# Patient Record
Sex: Male | Born: 1985
Health system: Southern US, Community
[De-identification: ages and names within clinical notes are randomized; demographics above are authoritative.]

---

## 2009-12-02 ENCOUNTER — Emergency Department (HOSPITAL_BASED_OUTPATIENT_CLINIC_OR_DEPARTMENT_OTHER): Admission: EM | Admit: 2009-12-02 | Discharge: 2009-12-02 | Payer: Self-pay | Admitting: Emergency Medicine

## 2010-12-20 LAB — RAPID STREP SCREEN (MED CTR MEBANE ONLY): Streptococcus, Group A Screen (Direct): NEGATIVE

## 2016-01-10 ENCOUNTER — Encounter (HOSPITAL_BASED_OUTPATIENT_CLINIC_OR_DEPARTMENT_OTHER): Payer: Self-pay | Admitting: Emergency Medicine

## 2016-01-10 ENCOUNTER — Emergency Department (HOSPITAL_BASED_OUTPATIENT_CLINIC_OR_DEPARTMENT_OTHER): Payer: Self-pay

## 2016-01-10 ENCOUNTER — Emergency Department (HOSPITAL_BASED_OUTPATIENT_CLINIC_OR_DEPARTMENT_OTHER)
Admission: EM | Admit: 2016-01-10 | Discharge: 2016-01-10 | Disposition: A | Discharge status: Home / Self Care | Payer: Self-pay | Attending: Emergency Medicine | Admitting: Emergency Medicine

## 2016-01-10 DIAGNOSIS — M79671 Pain in right foot: Secondary | ICD-10-CM | POA: Insufficient documentation

## 2016-01-10 DIAGNOSIS — F17200 Nicotine dependence, unspecified, uncomplicated: Secondary | ICD-10-CM | POA: Insufficient documentation

## 2016-01-10 DIAGNOSIS — Y929 Unspecified place or not applicable: Secondary | ICD-10-CM | POA: Insufficient documentation

## 2016-01-10 DIAGNOSIS — Y999 Unspecified external cause status: Secondary | ICD-10-CM | POA: Insufficient documentation

## 2016-01-10 DIAGNOSIS — Y9367 Activity, basketball: Secondary | ICD-10-CM | POA: Insufficient documentation

## 2016-01-10 DIAGNOSIS — X58XXXA Exposure to other specified factors, initial encounter: Secondary | ICD-10-CM | POA: Insufficient documentation

## 2016-01-10 MED ORDER — IBUPROFEN 800 MG PO TABS: 800.0000 mg | ORAL_TABLET | Freq: Once | ORAL | Status: DC

## 2016-01-10 MED ORDER — IBUPROFEN 800 MG PO TABS: 800.0000 mg | ORAL_TABLET | Freq: Three times a day (TID) | ORAL | Status: DC

## 2016-01-10 NOTE — ED Notes (Signed)
Pt states he injured R ankle playing basketball yesterday, woke today and ankle was swollen. Ambulatory with steady gait.

## 2016-01-10 NOTE — Discharge Instructions (Signed)
1. Medications: motrin, usual home medications 2. Treatment: rest, drink plenty of fluids, ice, elevate 3. Follow Up: please followup with your primary doctor and with ortho for discussion of your diagnoses and further evaluation after today's visit; if you do not have a primary care doctor use the phone number listed in your discharge paperwork to find one; please return to the ER for increased pain, numbness, new or worsening symptoms

## 2016-01-10 NOTE — ED Provider Notes (Signed)
CSN: [id number]     Arrival date & time 01/10/16  2125 History   First MD Initiated Contact with Patient 01/10/16 2301     Chief Complaint  Patient presents with  . Ankle Injury    HPI   Ruben Carter is a 30 y.o. male with no pertinent PMH who presents to the ED with right ankle pain. He states he was playing basketball yesterday, and reports his symptoms started after that time. He reports movement and weight bearing exacerbate his pain. He has not tried anything for symptom relief. He denies numbness, weakness, paresthesia, additional injury.   History reviewed. No pertinent past medical history. History reviewed. No pertinent past surgical history. History reviewed. No pertinent family history. Social History  Substance Use Topics  . Smoking status: Current Every Day Smoker  . Smokeless tobacco: None  . Alcohol Use: Yes     Review of Systems  Musculoskeletal: Positive for arthralgias.  Neurological: Negative for weakness and numbness.      Allergies  Penicillins  Home Medications   Prior to Admission medications   Medication Sig Start Date End Date Taking? Authorizing Provider  ibuprofen (ADVIL,MOTRIN) 800 MG tablet Take 1 tablet (800 mg total) by mouth 3 (three) times daily. 01/10/16   Dorise Hiss Westfall, PA-C    BP 145/91 mmHg  Pulse 89  Temp(Src) 99.4 F (37.4 C) (Oral)  Resp 18  Ht  (1.854 m)  Wt 127.007 kg  BMI 36.95 kg/m2  SpO2 99% Physical Exam  Constitutional: He is oriented to person, place, and time. He appears well-developed and well-nourished. No distress.  HENT:  Head: Normocephalic and atraumatic.  Right Ear: External ear normal.  Left Ear: External ear normal.  Nose: Nose normal.  Eyes: Conjunctivae and EOM are normal. Right eye exhibits no discharge. Left eye exhibits no discharge. No scleral icterus.  Neck: Normal range of motion. Neck supple.  Cardiovascular: Normal rate, regular rhythm and intact distal pulses.    Pulmonary/Chest: Effort normal and breath sounds normal. No respiratory distress.  Musculoskeletal: Normal range of motion. He exhibits tenderness. He exhibits no edema.  Mild tenderness to palpation to posterior aspect of heel. No tenderness to palpation to achilles. Full range of motion. Strength and sensation intact. Distal pulses intact. No significant edema, erythema, or heat.  Neurological: He is alert and oriented to person, place, and time. He has normal strength. No sensory deficit.  Skin: Skin is warm and dry. He is not diaphoretic.  Psychiatric: He has a normal mood and affect. His behavior is normal.  Nursing note and vitals reviewed.   ED Course  Procedures (including critical care time)  Labs Review Labs Reviewed - No data to display  Imaging Review Dg Ankle Complete Right  01/10/2016  CLINICAL DATA:  Right ankle injury while playing basketball yesterday. Continued swelling. EXAM: RIGHT ANKLE - COMPLETE 3+ VIEW COMPARISON:  None. FINDINGS: There is no evidence of fracture, dislocation, or joint effusion. There is no evidence of arthropathy or other focal bone abnormality. Soft tissues are unremarkable. IMPRESSION: Negative. Electronically Signed   By: Burman Nieves M.D.   On: 01/10/2016 22:22   I have personally reviewed and evaluated these images as part of my medical decision-making.   EKG Interpretation None      MDM   Final diagnoses:  Right foot pain    29 year old male presents with right ankle pain after playing basketball yesterday. Denies numbness, weakness, paresthesia. Patient is afebrile. Vital signs stable. On exam,  he has mild tenderness to palpation to the posterior aspect of his right heel. No tenderness to palpation to achilles. Full range of motion. Strength and sensation intact. Distal pulses intact. No significant edema, erythema, or heat. Imaging includes area where patient expresses pain on exam and is negative for fracture, dislocation, or  joint effusion. Discussed findings with patient. He is nontoxic and well-appearing, feel he is stable for discharge at this time. Will give ankle brace for comfort. Advised to rest, ice, elevate, and take ibuprofen for pain. Patient to follow-up with PCP and with ortho for persistent symptoms. Return precautions discussed. Patient verbalizes his understanding and is in agreement with plan.  BP 145/91 mmHg  Pulse 89  Temp(Src) 99.4 F (37.4 C) (Oral)  Resp 18  Ht 6\' 1"  (1.854 m)  Wt 127.007 kg  BMI 36.95 kg/m2  SpO2 99%    Ruben Gemmalizabeth C Westfall, PA-C 01/11/16 0020  Ruben LibraJohn Molpus, MD 01/11/16 [phone]

## 2016-01-10 NOTE — ED Notes (Signed)
Went to go apply splint and ice to pt and pt was not in room. Secretary stated that he just walked out, nurse said he was not discharged yet.

## 2016-01-11 NOTE — ED Notes (Signed)
Went to d/c pt and they were not in room.

## 2016-06-20 ENCOUNTER — Encounter (HOSPITAL_BASED_OUTPATIENT_CLINIC_OR_DEPARTMENT_OTHER): Payer: Self-pay | Admitting: *Deleted

## 2016-06-20 ENCOUNTER — Emergency Department (HOSPITAL_BASED_OUTPATIENT_CLINIC_OR_DEPARTMENT_OTHER)
Admission: EM | Admit: 2016-06-20 | Discharge: 2016-06-20 | Disposition: A | Discharge status: Home / Self Care | Payer: BLUE CROSS/BLUE SHIELD | Attending: Emergency Medicine | Admitting: Emergency Medicine

## 2016-06-20 DIAGNOSIS — L02411 Cutaneous abscess of right axilla: Secondary | ICD-10-CM | POA: Insufficient documentation

## 2016-06-20 DIAGNOSIS — F17210 Nicotine dependence, cigarettes, uncomplicated: Secondary | ICD-10-CM | POA: Insufficient documentation

## 2016-06-20 MED ORDER — LIDOCAINE HCL (PF) 1 % IJ SOLN
10.0000 mL | Freq: Once | INTRAMUSCULAR | Status: AC
  Administered 2016-06-20: 10 mL via INTRADERMAL
  Filled 2016-06-20: qty 10

## 2016-06-20 MED ORDER — SULFAMETHOXAZOLE-TRIMETHOPRIM 800-160 MG PO TABS: 1.0000 | ORAL_TABLET | Freq: Two times a day (BID) | ORAL | 0 refills | Status: AC

## 2016-06-20 MED FILL — SULFAMETHOXAZOLE-TMP DS TAB: 800-160 | 7 days supply | Qty: 14 | Fill #0

## 2016-06-20 NOTE — Discharge Instructions (Signed)
Take the antibiotic if. Read the attachments. You have increased pain, swelling, redness, fluid drainage, or bleeding. You have muscle aches, chills, or a general ill feeling. You have a fever.

## 2016-06-20 NOTE — ED Triage Notes (Signed)
Abscess under right arm x 2 days.  Denies fever

## 2016-06-22 NOTE — ED Provider Notes (Signed)
[facility]-EMERGENCY DEPT Provider Note   CSN: [id number] Arrival date & time: 06/20/16  1100     History   Chief Complaint Chief Complaint  Patient presents with  . Abscess    HPI Ruben Carter is a 30 y.o. male.   Patient presents for evaluation of a cutaneous abscess. Lesion is located in the right axilla. Onset was 2 days ago. Symptoms have gradually worsened. Abscess has associated symptoms of pain. Patient does not have previous history of cutaneous abscesses. Patient does not have diabetes. Denies: fever, chills, myalgias, malaise.             History reviewed. No pertinent past medical history.  There are no active problems to display for this patient.   History reviewed. No pertinent surgical history.     Home Medications    Prior to Admission medications   Medication Sig Start Date End Date Taking? Authorizing Provider  ibuprofen (ADVIL,MOTRIN) 800 MG tablet Take 1 tablet (800 mg total) by mouth 3 (three) times daily. 01/10/16   Mady Gemma, PA-C  sulfamethoxazole-trimethoprim (BACTRIM DS,SEPTRA DS) 800-160 MG tablet Take 1 tablet by mouth 2 (two) times daily. 06/20/16 06/27/16  Arthor Captain, PA-C   Family History History reviewed. No pertinent family history.  Social History Social History  Substance Use Topics  . Smoking status: Current Every Day Smoker    Packs/day: 0.50    Types: Cigarettes  . Smokeless tobacco: Never Used  . Alcohol use Yes     Allergies   Penicillins   Review of Systems Review of Systems Ten systems reviewed and are negative for acute change, except as noted in the HPI.    Physical Exam Updated Vital Signs BP 124/75 (BP Location: Left Arm)   Pulse 72   Temp 98.1 F (36.7 C) (Oral)   Resp 18   Ht 6\' 2"  (1.88 m)   Wt (!) 153.3 kg   SpO2 99%   BMI 43.40 kg/m   Physical Exam  Constitutional: He appears well-developed and well-nourished. No distress.  HENT:  Head: Normocephalic and atraumatic.    Eyes: Conjunctivae are normal. No scleral icterus.  Neck: Normal range of motion. Neck supple.  Cardiovascular: Normal rate, regular rhythm and normal heart sounds.   Pulmonary/Chest: Effort normal and breath sounds normal. No respiratory distress.  Abdominal: Soft. There is no tenderness.  Musculoskeletal: He exhibits no edema.  Neurological: He is alert.  Skin: Skin is warm and dry. He is not diaphoretic.  R AXILLA WITH FLUCTUANT MASS, MILD SURROUNDING INDURATION WITHOUT STREAKING.   Psychiatric: His behavior is normal.  Nursing note and vitals reviewed.    ED Treatments / Results  Labs (all labs ordered are listed, but only abnormal results are displayed) Labs Reviewed - No data to display  EKG  EKG Interpretation None       Radiology No results found.  Procedures .Marland KitchenIncision and Drainage Date/Time: 06/22/2016 3:48 PM Performed by: Arthor Captain Authorized by: Arthor Captain   Consent:    Consent obtained:  Verbal   Risks discussed:  Incomplete drainage, bleeding, pain, infection and damage to other organs   Alternatives discussed:  No treatment, delayed treatment, alternative treatment and observation Location:    Type:  Abscess   Size:  5   Location: R AXILLA. Pre-procedure details:    Skin preparation:  Betadine Anesthesia (see MAR for exact dosages):    Anesthesia method:  Local infiltration   Local anesthetic:  Lidocaine 1% w/o epi Procedure type:  Complexity:  Simple Procedure details:    Needle aspiration: no     Incision types:  Single straight   Incision depth:  Dermal   Scalpel blade:  11   Wound management:  Probed and deloculated, irrigated with saline and extensive cleaning   Drainage:  Purulent and bloody   Drainage amount:  Moderate   Wound treatment:  Wound left open   Packing materials:  None Post-procedure details:    Patient tolerance of procedure:  Tolerated well, no immediate complications   (including critical care  time)  Medications Ordered in ED Medications  lidocaine (PF) (XYLOCAINE) 1 % injection 10 mL (10 mLs Intradermal Given 06/20/16 1219)     Initial Impression / Assessment and Plan / ED Course  I have reviewed the triage vital signs and the nursing notes.  Pertinent labs & imaging results that were available during my care of the patient were reviewed by me and considered in my medical decision making (see chart for details).  Clinical Course   Patient with skin abscess amenable to incision and drainage.  Abscess was not large enough to warrant packing or drain,  wound recheck in 2 days. Encouraged home warm soaks and flushing.  Mild signs of cellulitis is surrounding skin.  Will d/c to home.  No antibiotic therapy is indicated.   Final Clinical Impressions(s) / ED Diagnoses   Final diagnoses:  Abscess of axilla, right    New Prescriptions Discharge Medication List as of 06/20/2016 12:57 PM    START taking these medications   Details  sulfamethoxazole-trimethoprim (BACTRIM DS,SEPTRA DS) 800-160 MG tablet Take 1 tablet by mouth 2 (two) times daily., Starting Mon 06/20/2016, Until Mon 06/27/2016, Print         MorrowAbigail Carloyn Lahue, PA-C 06/22/16 1550    Nelva Nayobert Beaton, MD 06/24/16 [phone]

## 2017-03-29 IMAGING — CR DG ANKLE COMPLETE 3+V*R*
3 series · 3 of 3 positions shown · non-contrast
Comparison: None.

CLINICAL DATA: Right ankle injury while playing basketball
yesterday. Continued swelling.

EXAM:
RIGHT ANKLE - COMPLETE 3+ VIEW

[t ankle joint ap right]
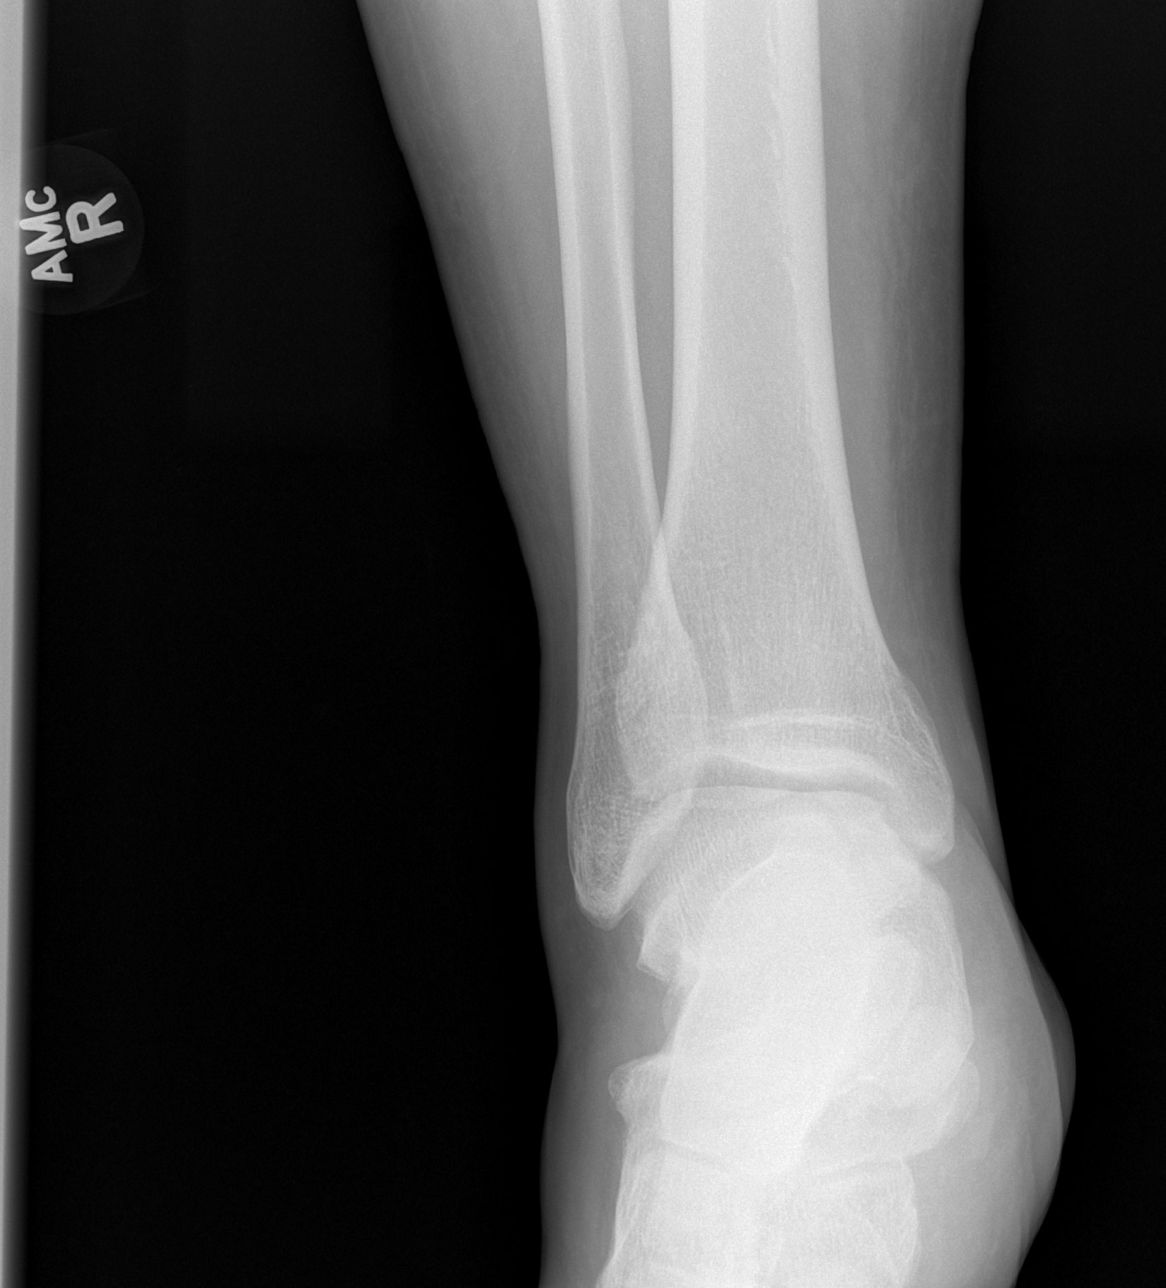

[t ankle joint oblique right]
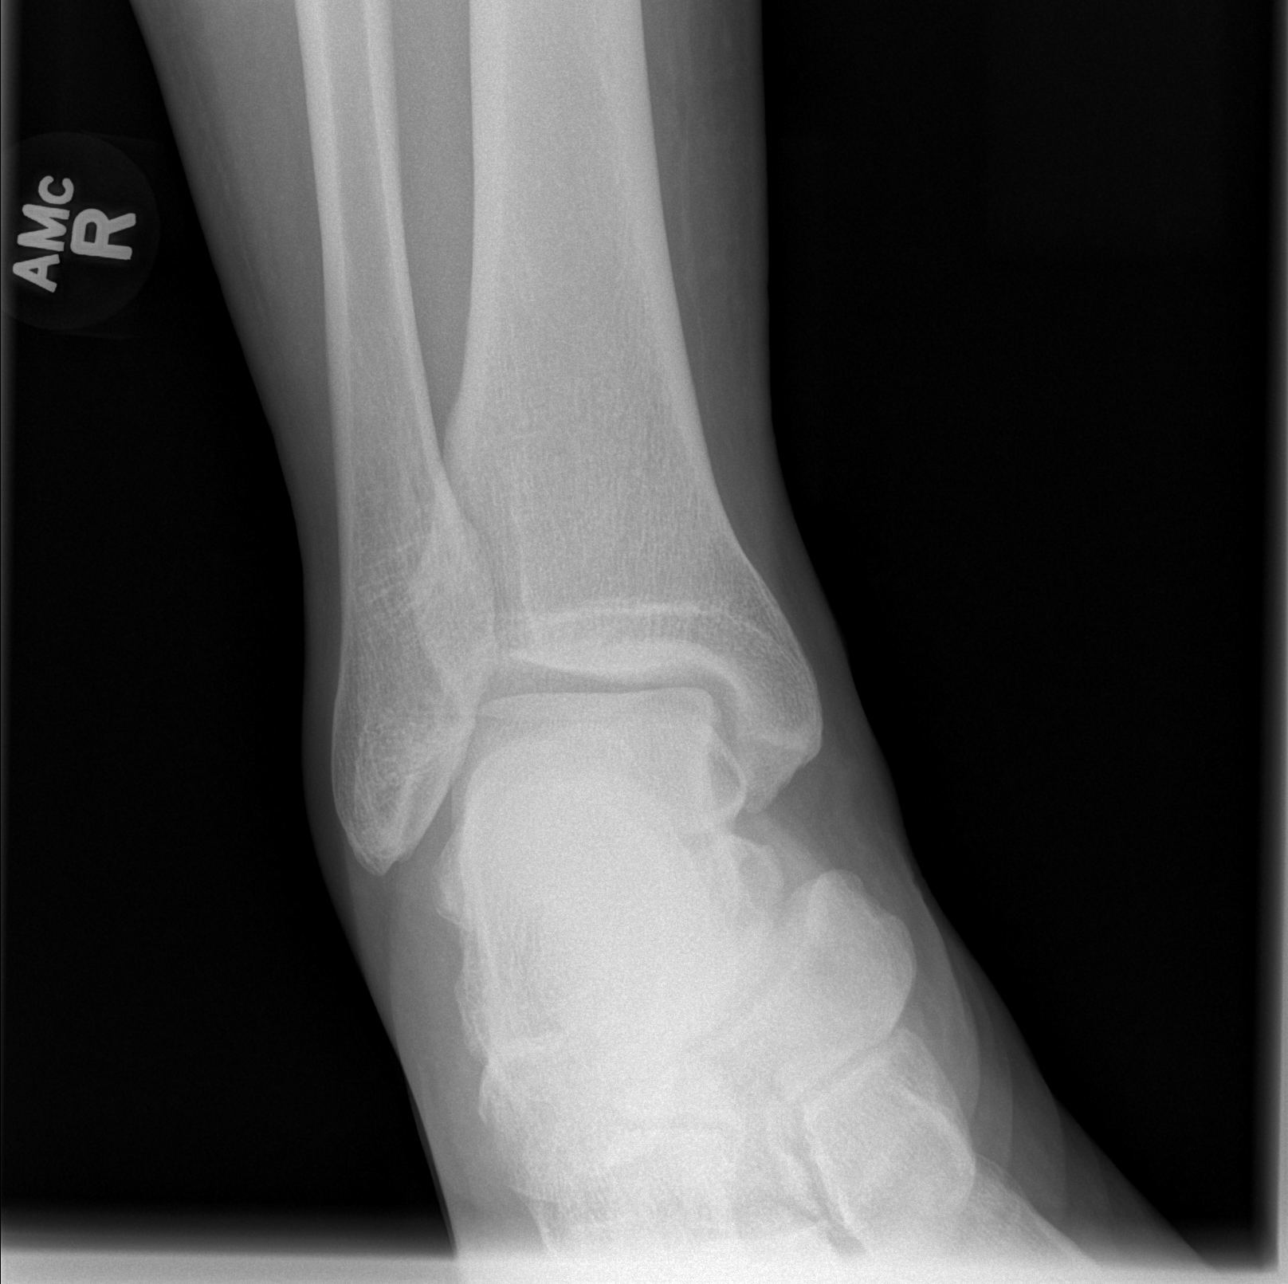

[t ankle joint lat right]
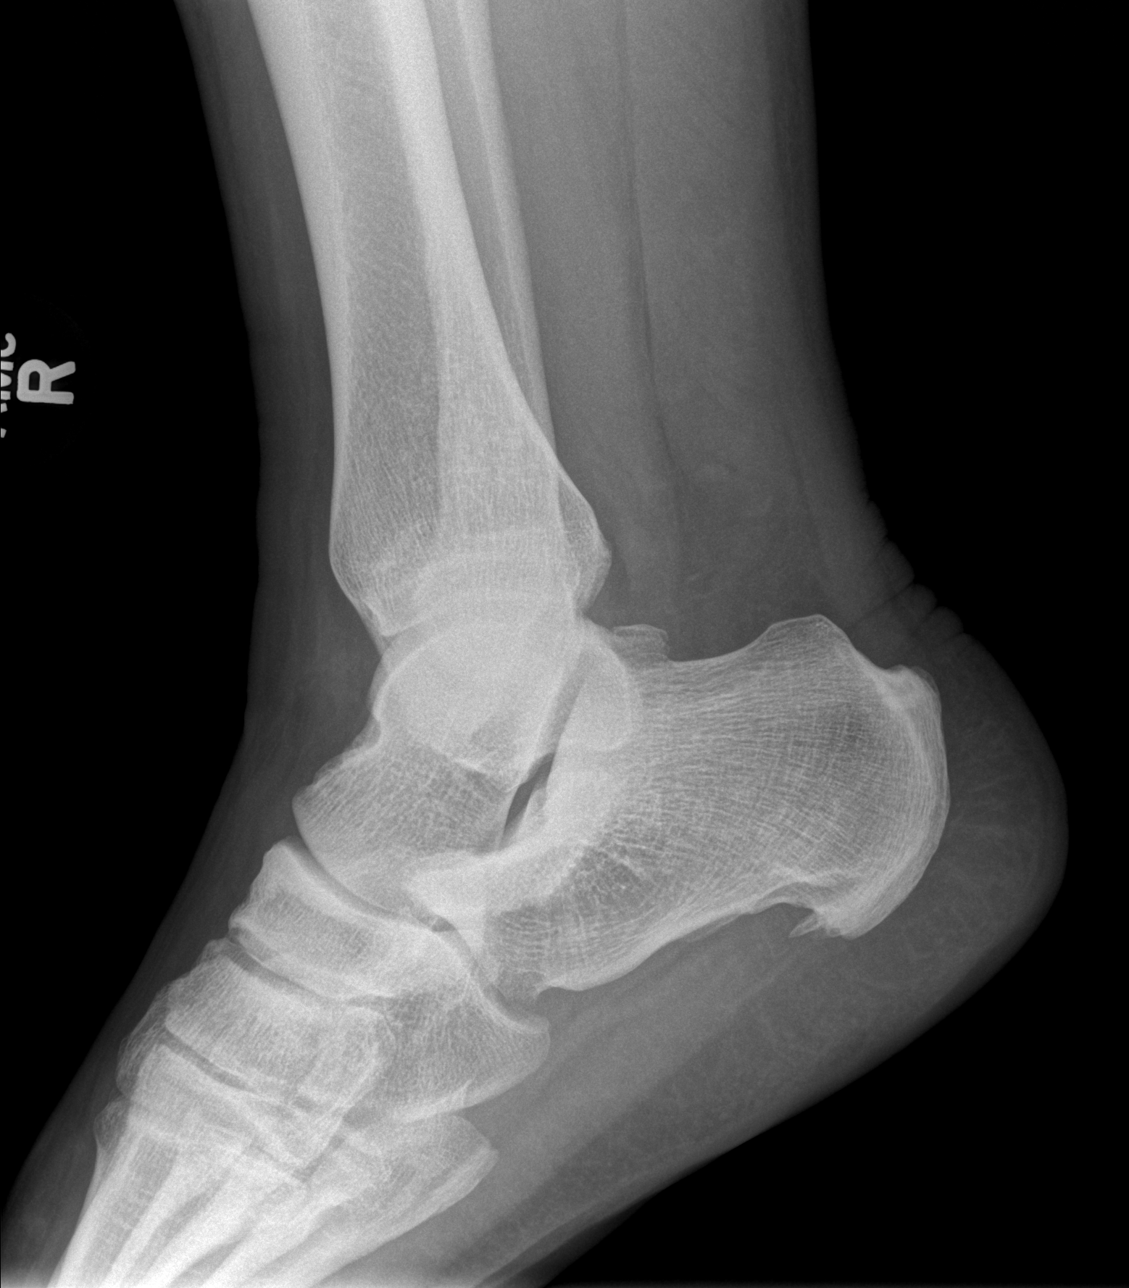

[3 of 3 positions shown; findings below may reference images not displayed]

FINDINGS: There is no evidence of fracture, dislocation, or joint effusion.
There is no evidence of arthropathy or other focal bone abnormality.
Soft tissues are unremarkable.
IMPRESSION: Negative.

## 2020-03-02 ENCOUNTER — Emergency Department (HOSPITAL_BASED_OUTPATIENT_CLINIC_OR_DEPARTMENT_OTHER)
Admission: EM | Admit: 2020-03-02 | Discharge: 2020-03-03 | Disposition: A | Discharge status: Home / Self Care | Payer: 59 | Attending: Emergency Medicine | Admitting: Emergency Medicine

## 2020-03-02 ENCOUNTER — Other Ambulatory Visit: Payer: Self-pay

## 2020-03-02 ENCOUNTER — Encounter (HOSPITAL_BASED_OUTPATIENT_CLINIC_OR_DEPARTMENT_OTHER): Payer: Self-pay | Admitting: Emergency Medicine

## 2020-03-02 DIAGNOSIS — R059 Cough, unspecified: Secondary | ICD-10-CM

## 2020-03-02 DIAGNOSIS — R05 Cough: Secondary | ICD-10-CM | POA: Diagnosis present

## 2020-03-02 DIAGNOSIS — F17210 Nicotine dependence, cigarettes, uncomplicated: Secondary | ICD-10-CM | POA: Diagnosis not present

## 2020-03-02 DIAGNOSIS — J3489 Other specified disorders of nose and nasal sinuses: Secondary | ICD-10-CM | POA: Insufficient documentation

## 2020-03-02 DIAGNOSIS — R0981 Nasal congestion: Secondary | ICD-10-CM | POA: Insufficient documentation

## 2020-03-02 DIAGNOSIS — Z20822 Contact with and (suspected) exposure to covid-19: Secondary | ICD-10-CM | POA: Insufficient documentation

## 2020-03-02 DIAGNOSIS — R439 Unspecified disturbances of smell and taste: Secondary | ICD-10-CM | POA: Diagnosis not present

## 2020-03-03 LAB — SARS CORONAVIRUS 2 (TAT 6-24 HRS): SARS Coronavirus 2: NEGATIVE

## 2020-03-03 NOTE — ED Triage Notes (Signed)
Cough, runny nose, congestion, loss of taste and smell since Sat

## 2020-03-03 NOTE — ED Provider Notes (Signed)
[facility] EMERGENCY DEPARTMENT Provider Note  CSN: [id number] Arrival date & time: 03/02/20 2323  Chief Complaint(s) Cough  HPI Ruben Carter is a 34 y.o. male   The history is provided by the patient.  Cough Cough characteristics:  Productive Sputum characteristics:  Yellow and clear Severity:  Moderate Onset quality:  Gradual Duration:  4 days Timing:  Constant Progression:  Waxing and waning Chronicity:  New Smoker: yes   Context: not sick contacts   Relieved by:  Nothing Worsened by:  Nothing Associated symptoms: rhinorrhea and sinus congestion   Associated symptoms: no chills, no fever, no headaches, no myalgias and no shortness of breath   Associated symptoms comment:  Loss of taste and smell    Past Medical History History reviewed. No pertinent past medical history. There are no problems to display for this patient.  Home Medication(s) Prior to Admission medications   Medication Sig Start Date End Date Taking? Authorizing Provider  ibuprofen (ADVIL,MOTRIN) 800 MG tablet Take 1 tablet (800 mg total) by mouth 3 (three) times daily. 01/10/16   Doran Stabler                                                                                                                                    Past Surgical History History reviewed. No pertinent surgical history. Family History No family history on file.  Social History Social History   Tobacco Use  . Smoking status: Current Every Day Smoker    Packs/day: 0.50    Types: Cigarettes  . Smokeless tobacco: Never Used  Substance Use Topics  . Alcohol use: Yes  . Drug use: No   Allergies Penicillins  Review of Systems Review of Systems  Constitutional: Negative for chills and fever.  HENT: Positive for rhinorrhea.   Respiratory: Positive for cough. Negative for shortness of breath.   Musculoskeletal: Negative for myalgias.  Neurological: Negative for headaches.   All other systems  are reviewed and are negative for acute change except as noted in the HPI  Physical Exam Vital Signs  I have reviewed the triage vital signs BP (!) 155/92 (BP Location: Left Arm)   Pulse 70   Temp 98.5 F (36.9 C) (Oral)   Resp 20   Ht 6\' 1"  (1.854 m)   Wt (!) 143.3 kg   SpO2 97%   BMI 41.69 kg/m   Physical Exam Vitals reviewed.  Constitutional:      General: He is not in acute distress.    Appearance: He is well-developed. He is not diaphoretic.  HENT:     Head: Normocephalic and atraumatic.     Nose: Mucosal edema and rhinorrhea present.     Mouth/Throat:     Comments: Post nasal drip  Eyes:     General: No scleral icterus.       Right eye: No discharge.        Left eye: No  discharge.     Conjunctiva/sclera: Conjunctivae normal.     Pupils: Pupils are equal, round, and reactive to light.  Cardiovascular:     Rate and Rhythm: Normal rate and regular rhythm.     Heart sounds: No murmur. No friction rub. No gallop.   Pulmonary:     Effort: Pulmonary effort is normal. No respiratory distress.     Breath sounds: Normal breath sounds. No stridor. No rales.  Abdominal:     General: There is no distension.     Palpations: Abdomen is soft.     Tenderness: There is no abdominal tenderness.  Musculoskeletal:        General: No tenderness.     Cervical back: Normal range of motion and neck supple.  Skin:    General: Skin is warm and dry.     Findings: No erythema or rash.  Neurological:     Mental Status: He is alert and oriented to person, place, and time.     ED Results and Treatments Labs (all labs ordered are listed, but only abnormal results are displayed) Labs Reviewed  SARS CORONAVIRUS 2 (TAT 6-24 HRS)                                                                                                                         EKG  EKG Interpretation  Date/Time:    Ventricular Rate:    PR Interval:    QRS Duration:   QT Interval:    QTC Calculation:   R  Axis:     Text Interpretation:        Radiology No results found.  Pertinent labs & imaging results that were available during my care of the patient were reviewed by me and considered in my medical decision making (see chart for details).  Medications Ordered in ED Medications - No data to display                                                                                                                                  Procedures Procedures  (including critical care time)  Medical Decision Making / ED Course I have reviewed the nursing notes for this encounter and the patient's prior records (if available in EHR or on provided paperwork).   Ruben Carter was evaluated in Emergency Department on 03/03/2020 for the symptoms described in the history of present illness. He was evaluated in  the context of the global COVID-19 pandemic, which necessitated consideration that the patient might be at risk for infection with the SARS-CoV-2 virus that causes COVID-19. Institutional protocols and algorithms that pertain to the evaluation of patients at risk for COVID-19 are in a state of rapid change based on information released by regulatory bodies including the CDC and federal and state organizations. These policies and algorithms were followed during the patient's care in the ED.  Patient presents with viral URI vs allergic rhinitis symptoms for 3-4 days. adequate oral hydration. Rest of history as above.  Patient appears well. No signs of toxicity. No hypoxia, tachypnea or other signs of respiratory distress. No sign of clinical dehydration. Lung exam clear. Rest of exam as above.  No evidence suggestive of pharyngitis, AOM, PNA, or meningitis.  Chest x-ray not indicated at this time.  Patient evaluated, tested and sent home with instructions for home care and Quarantine. Instructed to seek further care if symptoms worsen.  Is set up with follow up for a virtual visit with PCP in next  24-48 hours.  Discussed symptomatic treatment with the patient and they will follow closely with their PCP.        Final Clinical Impression(s) / ED Diagnoses Final diagnoses:  Nasal congestion  Cough   The patient appears reasonably screened and/or stabilized for discharge and I doubt any other medical condition or other [facility] requiring further screening, evaluation, or treatment in the ED at this time prior to discharge. Safe for discharge with strict return precautions.  Disposition: Discharge  Condition: Good  I have discussed the results, Dx and Tx plan with the patient/family who expressed understanding and agree(s) with the plan. Discharge instructions discussed at length. The patient/family was given strict return precautions who verbalized understanding of the instructions. No further questions at time of discharge.    ED Discharge Orders    None       Follow Up: Primary care provider  Schedule an appointment as soon as possible for a visit  in 5-7 days, If symptoms do not improve or  worsen      This chart was dictated using voice recognition software.  Despite best efforts to proofread,  errors can occur which can change the documentation meaning.   Nira Conn, MD 03/03/20 [phone]

## 2024-04-16 ENCOUNTER — Emergency Department (HOSPITAL_BASED_OUTPATIENT_CLINIC_OR_DEPARTMENT_OTHER)

## 2024-04-16 ENCOUNTER — Emergency Department (HOSPITAL_BASED_OUTPATIENT_CLINIC_OR_DEPARTMENT_OTHER)
Admission: EM | Admit: 2024-04-16 | Discharge: 2024-04-16 | Disposition: A | Discharge status: Home / Self Care | Attending: Emergency Medicine | Admitting: Emergency Medicine

## 2024-04-16 ENCOUNTER — Encounter (HOSPITAL_BASED_OUTPATIENT_CLINIC_OR_DEPARTMENT_OTHER): Payer: Self-pay | Admitting: *Deleted

## 2024-04-16 ENCOUNTER — Other Ambulatory Visit: Payer: Self-pay

## 2024-04-16 DIAGNOSIS — Y92410 Unspecified street and highway as the place of occurrence of the external cause: Secondary | ICD-10-CM | POA: Diagnosis not present

## 2024-04-16 DIAGNOSIS — M25561 Pain in right knee: Secondary | ICD-10-CM | POA: Diagnosis present

## 2024-04-16 DIAGNOSIS — M545 Low back pain, unspecified: Secondary | ICD-10-CM | POA: Insufficient documentation

## 2024-04-16 MED ORDER — IBUPROFEN 800 MG PO TABS: 800.0000 mg | ORAL_TABLET | Freq: Three times a day (TID) | ORAL | 0 refills | Status: DC

## 2024-04-16 MED ORDER — LIDOCAINE 5 % EX PTCH: 1.0000 | MEDICATED_PATCH | CUTANEOUS | 0 refills | Status: DC

## 2024-04-16 MED ORDER — ACETAMINOPHEN 325 MG PO TABS
650.0000 mg | ORAL_TABLET | Freq: Once | ORAL | Status: AC
  Administered 2024-04-16: 650 mg via ORAL
  Filled 2024-04-16: qty 2

## 2024-04-16 MED ORDER — CYCLOBENZAPRINE HCL 10 MG PO TABS: 5.0000 mg | ORAL_TABLET | Freq: Every evening | ORAL | 0 refills | Status: AC | PRN

## 2024-04-16 MED ORDER — CYCLOBENZAPRINE HCL 10 MG PO TABS: 5.0000 mg | ORAL_TABLET | Freq: Every evening | ORAL | 0 refills | Status: DC | PRN

## 2024-04-16 MED ORDER — IBUPROFEN 800 MG PO TABS: 800.0000 mg | ORAL_TABLET | Freq: Three times a day (TID) | ORAL | 0 refills | Status: AC

## 2024-04-16 MED ORDER — LIDOCAINE 5 % EX PTCH: 1.0000 | MEDICATED_PATCH | CUTANEOUS | 0 refills | Status: AC

## 2024-04-16 MED ORDER — NAPROXEN 250 MG PO TABS
500.0000 mg | ORAL_TABLET | Freq: Once | ORAL | Status: AC
  Administered 2024-04-16: 500 mg via ORAL
  Filled 2024-04-16: qty 2
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
